# Patient Record
Sex: Female | Born: 1985 | Race: White | Hispanic: No | Marital: Single | Smoking: Light tobacco smoker
Health system: Southern US, Community
[De-identification: ages and names within clinical notes are randomized; demographics above are authoritative.]

## PROBLEM LIST (undated history)

## (undated) DIAGNOSIS — J45909 Unspecified asthma, uncomplicated: Secondary | ICD-10-CM

## (undated) DIAGNOSIS — R768 Other specified abnormal immunological findings in serum: Secondary | ICD-10-CM

---

## 2009-10-09 ENCOUNTER — Emergency Department: Payer: Self-pay | Admitting: Internal Medicine

## 2015-06-23 ENCOUNTER — Other Ambulatory Visit: Payer: Self-pay

## 2015-06-23 ENCOUNTER — Emergency Department
Admission: EM | Admit: 2015-06-23 | Discharge: 2015-06-23 | Disposition: A | Discharge status: Home / Self Care | Payer: Medicaid Other | Attending: Emergency Medicine | Admitting: Emergency Medicine

## 2015-06-23 ENCOUNTER — Emergency Department: Payer: Medicaid Other

## 2015-06-23 DIAGNOSIS — Z720 Tobacco use: Secondary | ICD-10-CM | POA: Insufficient documentation

## 2015-06-23 DIAGNOSIS — R197 Diarrhea, unspecified: Secondary | ICD-10-CM | POA: Insufficient documentation

## 2015-06-23 DIAGNOSIS — R079 Chest pain, unspecified: Secondary | ICD-10-CM | POA: Insufficient documentation

## 2015-06-23 DIAGNOSIS — R0602 Shortness of breath: Secondary | ICD-10-CM | POA: Diagnosis not present

## 2015-06-23 HISTORY — DX: Unspecified asthma, uncomplicated: J45.909

## 2015-06-23 LAB — CBC WITH DIFFERENTIAL/PLATELET
Basophils Absolute: 0 10*3/uL (ref 0–0.1)
Basophils Relative: 0 %
EOS PCT: 1 %
Eosinophils Absolute: 0.1 10*3/uL (ref 0–0.7)
HCT: 41.8 % (ref 35.0–47.0)
Hemoglobin: 14.2 g/dL (ref 12.0–16.0)
Lymphocytes Relative: 11 %
Lymphs Abs: 0.9 10*3/uL — ABNORMAL LOW (ref 1.0–3.6)
MCH: 29.1 pg (ref 26.0–34.0)
MCHC: 33.9 g/dL (ref 32.0–36.0)
MCV: 85.9 fL (ref 80.0–100.0)
MONO ABS: 1.2 10*3/uL — AB (ref 0.2–0.9)
MONOS PCT: 14 %
NEUTROS ABS: 6.3 10*3/uL (ref 1.4–6.5)
Neutrophils Relative %: 74 %
PLATELETS: 193 10*3/uL (ref 150–440)
RBC: 4.87 MIL/uL (ref 3.80–5.20)
RDW: 13.2 % (ref 11.5–14.5)
WBC: 8.5 10*3/uL (ref 3.6–11.0)

## 2015-06-23 LAB — BASIC METABOLIC PANEL
ANION GAP: 7 (ref 5–15)
BUN: 14 mg/dL (ref 6–20)
CALCIUM: 8.8 mg/dL — AB (ref 8.9–10.3)
CO2: 23 mmol/L (ref 22–32)
Chloride: 107 mmol/L (ref 101–111)
Creatinine, Ser: 0.66 mg/dL (ref 0.44–1.00)
GFR calc Af Amer: >60 mL/min (ref >60)
GFR calc non Af Amer: >60 mL/min (ref >60)
Glucose, Bld: 108 mg/dL — ABNORMAL HIGH (ref 65–99)
Potassium: 3.6 mmol/L (ref 3.5–5.1)
Sodium: 137 mmol/L (ref 135–145)

## 2015-06-23 LAB — TROPONIN I: Troponin I: <0.03 ng/mL (ref <0.031)

## 2015-06-23 MED ORDER — IPRATROPIUM-ALBUTEROL 0.5-2.5 (3) MG/3ML IN SOLN
3.0000 mL | Freq: Once | RESPIRATORY_TRACT | Status: AC
  Administered 2015-06-23: 3 mL via RESPIRATORY_TRACT
  Filled 2015-06-23: qty 3

## 2015-06-23 MED ORDER — ALBUTEROL SULFATE HFA 108 (90 BASE) MCG/ACT IN AERS: 2.0000 | INHALATION_SPRAY | Freq: Four times a day (QID) | RESPIRATORY_TRACT | Status: AC | PRN

## 2015-06-23 NOTE — ED Provider Notes (Signed)
[facility] Emergency Department Provider Note   ____________________________________________  Time seen: 0825  I have reviewed the triage vital signs and the nursing notes.   HISTORY  Chief Complaint Chest Pain and Shortness of Breath   History limited by: Not Limited   HPI Brandy Davies is a 29 y.o. female who presents to the emergency department today because of chest pain and shortness of breath. She states the symptoms started roughly 6 hours ago. They started suddenly. She describes the chest pain as a tight feeling located in the left chest. It has been constant. It is associated with shortness of breath. Patient states that yesterday she did have some nausea and vomiting as well as diarrhea. She continues to have some nausea and gagging. She denies any fevers the past couple of days.   Past Medical History  Diagnosis Date  . Asthma     There are no active problems to display for this patient.   History reviewed. No pertinent past surgical history.  No current outpatient prescriptions on file.  Allergies Review of patient's allergies indicates no known allergies.  No family history on file.  Social History History  Substance Use Topics  . Smoking status: Current Every Day Smoker    Types: Cigarettes  . Smokeless tobacco: Not on file  . Alcohol Use: No    Review of Systems  Constitutional: Negative for fever. Cardiovascular: Positive for chest pain. Respiratory: Positive for shortness of breath. Gastrointestinal: Negative for abdominal pain. Positive for diarrhea and vomiting. Genitourinary: Negative for dysuria. Musculoskeletal: Negative for back pain. Skin: Negative for rash. Neurological: Negative for headaches, focal weakness or numbness.  10-point ROS otherwise negative.  ____________________________________________   PHYSICAL EXAM:  VITAL SIGNS: ED Triage Vitals  Enc Vitals Group     BP 06/23/15 0755 112/62  mmHg     Pulse Rate 06/23/15 0755 90     Resp 06/23/15 0755 22     Temp 06/23/15 0755 97.6 F (36.4 C)     Temp Source 06/23/15 0755 Oral     SpO2 06/23/15 0755 98 %     Weight 06/23/15 0755 235 lb (106.595 kg)     Height 06/23/15 0755  (1.6 m)     Head Cir --      Peak Flow --      Pain Score 06/23/15 0755 4   Constitutional: Alert and oriented. Well appearing and in no distress. Eyes: Conjunctivae are normal. PERRL. Normal extraocular movements. ENT   Head: Normocephalic and atraumatic.   Nose: No congestion/rhinnorhea.   Mouth/Throat: Mucous membranes are moist.   Neck: No stridor. Hematological/Lymphatic/Immunilogical: No cervical lymphadenopathy. Cardiovascular: Normal rate, regular rhythm.  No murmurs, rubs, or gallops. Respiratory: Normal respiratory effort without tachypnea nor retractions. Breath sounds are clear and equal bilaterally. No wheezes/rales/rhonchi. Gastrointestinal: Soft and nontender. No distention.  Genitourinary: Deferred Musculoskeletal: Normal range of motion in all extremities. No joint effusions.  No lower extremity tenderness nor edema. Neurologic:  Normal speech and language. No gross focal neurologic deficits are appreciated. Speech is normal.  Skin:  Skin is warm, dry and intact. No rash noted. Psychiatric: Mood and affect are normal. Speech and behavior are normal. Patient exhibits appropriate insight and judgment.  ____________________________________________    LABS (pertinent positives/negatives)  Labs Reviewed  CBC WITH DIFFERENTIAL/PLATELET - Abnormal; Notable for the following:    Lymphs Abs 0.9 (*)    Monocytes Absolute 1.2 (*)    All other components within normal limits  BASIC METABOLIC PANEL - Abnormal; Notable for the following:    Glucose, Bld 108 (*)    Calcium 8.8 (*)    All other components within normal limits  TROPONIN I     ____________________________________________   EKG  I, Phineas Semen,  attending physician, personally viewed and interpreted this EKG  EKG Time: 0800 Rate: 90 Rhythm: normal sinus rhythm Axis: normal Intervals: normal QRS: normal ST changes: no st elevation    ____________________________________________    RADIOLOGY  CXR IMPRESSION: No active cardiopulmonary disease.  ____________________________________________   PROCEDURES  Procedure(s) performed: None  Critical Care performed: No  ____________________________________________   INITIAL IMPRESSION / ASSESSMENT AND PLAN / ED COURSE  Pertinent labs & imaging results that were available during my care of the patient were reviewed by me and considered in my medical decision making (see chart for details).  Patient presented to the emergency department today because of concerns for shortness breath and chest pain. Patient had EKG chest x-ray and blood work done without any concerning findings. PERC negative. Patient did state she felt better after the DuoNeb treatment. Does have a history of asthma. Will discharge home with albuterol inhaler. Discussed return precautions.  ____________________________________________   FINAL CLINICAL IMPRESSION(S) / ED DIAGNOSES  Final diagnoses:  Chest pain, unspecified chest pain type  Shortness of breath     Phineas Semen, MD 06/23/15 1031

## 2015-06-23 NOTE — Discharge Instructions (Signed)
Please seek medical attention for any high fevers, chest pain, shortness of breath, change in behavior, persistent vomiting, bloody stool or any other new or concerning symptoms. ° °Chest Pain (Nonspecific) °It is often hard to give a specific diagnosis for the cause of chest pain. There is always a chance that your pain could be related to something serious, such as a heart attack or a blood clot in the lungs. You need to follow up with your health care provider for further evaluation. °CAUSES  °· Heartburn. °· Pneumonia or bronchitis. °· Anxiety or stress. °· Inflammation around your heart (pericarditis) or lung (pleuritis or pleurisy). °· A blood clot in the lung. °· A collapsed lung (pneumothorax). It can develop suddenly on its own (spontaneous pneumothorax) or from trauma to the chest. °· Shingles infection (herpes zoster virus). °The chest wall is composed of bones, muscles, and cartilage. Any of these can be the source of the pain. °· The bones can be bruised by injury. °· The muscles or cartilage can be strained by coughing or overwork. °· The cartilage can be affected by inflammation and become sore (costochondritis). °DIAGNOSIS  °Lab tests or other studies may be needed to find the cause of your pain. Your health care provider may have you take a test called an ambulatory electrocardiogram (ECG). An ECG records your heartbeat patterns over a 24-hour period. You may also have other tests, such as: °· Transthoracic echocardiogram (TTE). During echocardiography, sound waves are used to evaluate how blood flows through your heart. °· Transesophageal echocardiogram (TEE). °· Cardiac monitoring. This allows your health care provider to monitor your heart rate and rhythm in real time. °· Holter monitor. This is a portable device that records your heartbeat and can help diagnose heart arrhythmias. It allows your health care provider to track your heart activity for several days, if needed. °· Stress tests by  exercise or by giving medicine that makes the heart beat faster. °TREATMENT  °· Treatment depends on what may be causing your chest pain. Treatment may include: °¨ Acid blockers for heartburn. °¨ Anti-inflammatory medicine. °¨ Pain medicine for inflammatory conditions. °¨ Antibiotics if an infection is present. °· You may be advised to change lifestyle habits. This includes stopping smoking and avoiding alcohol, caffeine, and chocolate. °· You may be advised to keep your head raised (elevated) when sleeping. This reduces the chance of acid going backward from your stomach into your esophagus. °Most of the time, nonspecific chest pain will improve within 2-3 days with rest and mild pain medicine.  °HOME CARE INSTRUCTIONS  °· If antibiotics were prescribed, take them as directed. Finish them even if you start to feel better. °· For the next few days, avoid physical activities that bring on chest pain. Continue physical activities as directed. °· Do not use any tobacco products, including cigarettes, chewing tobacco, or electronic cigarettes. °· Avoid drinking alcohol. °· Only take medicine as directed by your health care provider. °· Follow your health care provider's suggestions for further testing if your chest pain does not go away. °· Keep any follow-up appointments you made. If you do not go to an appointment, you could develop lasting (chronic) problems with pain. If there is any problem keeping an appointment, call to reschedule. °SEEK MEDICAL CARE IF:  °· Your chest pain does not go away, even after treatment. °· You have a rash with blisters on your chest. °· You have a fever. °SEEK IMMEDIATE MEDICAL CARE IF:  °· You have increased chest   pain or pain that spreads to your arm, neck, jaw, back, or abdomen. °· You have shortness of breath. °· You have an increasing cough, or you cough up blood. °· You have severe back or abdominal pain. °· You feel nauseous or vomit. °· You have severe weakness. °· You  faint. °· You have chills. °This is an emergency. Do not wait to see if the pain will go away. Get medical help at once. Call your local emergency services (911 in [place] Do not drive yourself to the hospital. °MAKE SURE YOU:  °· Understand these instructions. °· Will watch your condition. °· Will get help right away if you are not doing well or get worse. °Document Released: 08/28/2005 Document Revised: 11/23/2013 Document Reviewed: 06/23/2008 °ExitCare® Patient Information ©2015 [employer]. This information is not intended to replace advice given to you by your health care provider. Make sure you discuss any questions you have with your health care provider. ° °

## 2015-06-23 NOTE — ED Notes (Signed)
Pt c/o having N/V yesterday and then last night began having chest pressure with SOB through today.Marland Kitchen

## 2015-10-12 ENCOUNTER — Ambulatory Visit
Admission: EM | Admit: 2015-10-12 | Discharge: 2015-10-12 | Disposition: A | Discharge status: Home / Self Care | Payer: Medicaid Other | Attending: Family Medicine | Admitting: Family Medicine

## 2015-10-12 ENCOUNTER — Emergency Department: Payer: Medicaid Other

## 2015-10-12 ENCOUNTER — Encounter: Payer: Self-pay | Admitting: Emergency Medicine

## 2015-10-12 ENCOUNTER — Emergency Department
Admission: EM | Admit: 2015-10-12 | Discharge: 2015-10-12 | Disposition: A | Discharge status: Home / Self Care | Payer: Medicaid Other | Attending: Student | Admitting: Student

## 2015-10-12 DIAGNOSIS — O200 Threatened abortion: Secondary | ICD-10-CM | POA: Insufficient documentation

## 2015-10-12 DIAGNOSIS — O209 Hemorrhage in early pregnancy, unspecified: Secondary | ICD-10-CM | POA: Diagnosis present

## 2015-10-12 DIAGNOSIS — O210 Mild hyperemesis gravidarum: Secondary | ICD-10-CM | POA: Diagnosis not present

## 2015-10-12 DIAGNOSIS — N939 Abnormal uterine and vaginal bleeding, unspecified: Secondary | ICD-10-CM | POA: Diagnosis present

## 2015-10-12 DIAGNOSIS — Z79899 Other long term (current) drug therapy: Secondary | ICD-10-CM | POA: Insufficient documentation

## 2015-10-12 DIAGNOSIS — Z3A01 Less than 8 weeks gestation of pregnancy: Secondary | ICD-10-CM | POA: Insufficient documentation

## 2015-10-12 DIAGNOSIS — F17210 Nicotine dependence, cigarettes, uncomplicated: Secondary | ICD-10-CM | POA: Insufficient documentation

## 2015-10-12 DIAGNOSIS — R112 Nausea with vomiting, unspecified: Secondary | ICD-10-CM | POA: Diagnosis present

## 2015-10-12 DIAGNOSIS — O99331 Smoking (tobacco) complicating pregnancy, first trimester: Secondary | ICD-10-CM | POA: Insufficient documentation

## 2015-10-12 HISTORY — DX: Other specified abnormal immunological findings in serum: R76.8

## 2015-10-12 LAB — URINALYSIS COMPLETE WITH MICROSCOPIC (ARMC ONLY)
BILIRUBIN URINE: NEGATIVE
GLUCOSE, UA: NEGATIVE mg/dL
KETONES UR: NEGATIVE mg/dL
Leukocytes, UA: NEGATIVE
NITRITE: NEGATIVE
Protein, ur: NEGATIVE mg/dL
SPECIFIC GRAVITY, URINE: 1.02 (ref 1.005–1.030)
pH: 7 (ref 5.0–8.0)

## 2015-10-12 LAB — COMPREHENSIVE METABOLIC PANEL
ALT: 20 U/L (ref 14–54)
ANION GAP: 5 (ref 5–15)
AST: 20 U/L (ref 15–41)
Albumin: 4.1 g/dL (ref 3.5–5.0)
Alkaline Phosphatase: 55 U/L (ref 38–126)
BUN: 10 mg/dL (ref 6–20)
CO2: 22 mmol/L (ref 22–32)
Calcium: 8.8 mg/dL — ABNORMAL LOW (ref 8.9–10.3)
Chloride: 108 mmol/L (ref 101–111)
Creatinine, Ser: 0.54 mg/dL (ref 0.44–1.00)
GFR calc Af Amer: >60 mL/min (ref >60)
Glucose, Bld: 103 mg/dL — ABNORMAL HIGH (ref 65–99)
Potassium: 3.9 mmol/L (ref 3.5–5.1)
SODIUM: 135 mmol/L (ref 135–145)
TOTAL PROTEIN: 7.4 g/dL (ref 6.5–8.1)
Total Bilirubin: 0.5 mg/dL (ref 0.3–1.2)

## 2015-10-12 LAB — CBC WITH DIFFERENTIAL/PLATELET
Basophils Absolute: 0.1 10*3/uL (ref 0–0.1)
Basophils Relative: 1 %
EOS ABS: 0.1 10*3/uL (ref 0–0.7)
EOS PCT: 1 %
HCT: 43.4 % (ref 35.0–47.0)
Hemoglobin: 14.6 g/dL (ref 12.0–16.0)
LYMPHS ABS: 2.2 10*3/uL (ref 1.0–3.6)
Lymphocytes Relative: 23 %
MCH: 29.1 pg (ref 26.0–34.0)
MCHC: 33.7 g/dL (ref 32.0–36.0)
MCV: 86.5 fL (ref 80.0–100.0)
Monocytes Absolute: 0.5 10*3/uL (ref 0.2–0.9)
Monocytes Relative: 6 %
NEUTROS ABS: 6.4 10*3/uL (ref 1.4–6.5)
NEUTROS PCT: 69 %
PLATELETS: 236 10*3/uL (ref 150–440)
RBC: 5.02 MIL/uL (ref 3.80–5.20)
RDW: 12.8 % (ref 11.5–14.5)
WBC: 9.2 10*3/uL (ref 3.6–11.0)

## 2015-10-12 LAB — LIPASE, BLOOD: LIPASE: 31 U/L (ref 11–51)

## 2015-10-12 LAB — PREGNANCY, URINE: Preg Test, Ur: POSITIVE — AB

## 2015-10-12 LAB — ANTIBODY SCREEN: ANTIBODY SCREEN: NEGATIVE

## 2015-10-12 LAB — HCG, QUANTITATIVE, PREGNANCY: HCG, BETA CHAIN, QUANT, S: 34492 m[IU]/mL — AB (ref <5)

## 2015-10-12 LAB — ABO/RH: ABO/RH(D): A NEG

## 2015-10-12 LAB — POCT PREGNANCY, URINE: Preg Test, Ur: POSITIVE — AB

## 2015-10-12 MED ORDER — SODIUM CHLORIDE 0.9 % IV BOLUS (SEPSIS): 1000.0000 mL | Freq: Once | INTRAVENOUS | Status: DC

## 2015-10-12 MED ORDER — RHO D IMMUNE GLOBULIN 1500 UNIT/2ML IJ SOSY
300.0000 ug | PREFILLED_SYRINGE | Freq: Once | INTRAMUSCULAR | Status: AC
  Administered 2015-10-12: 300 ug via INTRAMUSCULAR
  Filled 2015-10-12: qty 2

## 2015-10-12 NOTE — ED Notes (Signed)
Called blood bank about rho immune globulin injection, vial in process, blood bank will call when ready.

## 2015-10-12 NOTE — Discharge Instructions (Signed)
Go directly to the Emergency room as discussed. Return to Urgent care as needed.   Threatened Miscarriage A threatened miscarriage occurs when you have vaginal bleeding during your first 20 weeks of pregnancy but the pregnancy has not ended. If you have vaginal bleeding during this time, your health care provider will do tests to make sure you are still pregnant. If the tests show you are still pregnant and the developing baby (fetus) inside your womb (uterus) is still growing, your condition is considered a threatened miscarriage. A threatened miscarriage does not mean your pregnancy will end, but it does increase the risk of losing your pregnancy (complete miscarriage). CAUSES  The cause of a threatened miscarriage is usually not known. If you go on to have a complete miscarriage, the most common cause is an abnormal number of chromosomes in the developing baby. Chromosomes are the structures inside cells that hold all your genetic material. Some causes of vaginal bleeding that do not result in miscarriage include:  Having sex.  Having an infection.  Normal hormone changes of pregnancy.  Bleeding that occurs when an egg implants in your uterus. RISK FACTORS Risk factors for bleeding in early pregnancy include:  Obesity.  Smoking.  Drinking excessive amounts of alcohol or caffeine.  Recreational drug use. SIGNS AND SYMPTOMS  Light vaginal bleeding.  Mild abdominal pain or cramps. DIAGNOSIS  If you have bleeding with or without abdominal pain before 20 weeks of pregnancy, your health care provider will do tests to check whether you are still pregnant. One important test involves using sound waves and a computer (ultrasound) to create images of the inside of your uterus. Other tests include an internal exam of your vagina and uterus (pelvic exam) and measurement of your baby's heart rate.  You may be diagnosed with a threatened miscarriage if:  Ultrasound testing shows you are still  pregnant.  Your baby's heart rate is strong.  A pelvic exam shows that the opening between your uterus and your vagina (cervix) is closed.  Your heart rate and blood pressure are stable.  Blood tests confirm you are still pregnant. TREATMENT  No treatments have been shown to prevent a threatened miscarriage from going on to a complete miscarriage. However, the right home care is important.  HOME CARE INSTRUCTIONS   Make sure you keep all your appointments for prenatal care. This is very important.  Get plenty of rest.  Do not have sex or use tampons if you have vaginal bleeding.  Do not douche.  Do not smoke or use recreational drugs.  Do not drink alcohol.  Avoid caffeine. SEEK MEDICAL CARE IF:  You have light vaginal bleeding or spotting while pregnant.  You have abdominal pain or cramping.  You have a fever. SEEK IMMEDIATE MEDICAL CARE IF:  You have heavy vaginal bleeding.  You have blood clots coming from your vagina.  You have severe low back pain or abdominal cramps.  You have fever, chills, and severe abdominal pain. MAKE SURE YOU:  Understand these instructions.  Will watch your condition.  Will get help right away if you are not doing well or get worse.   This information is not intended to replace advice given to you by your health care provider. Make sure you discuss any questions you have with your health care provider.   Document Released: 11/18/2005 Document Revised: 11/23/2013 Document Reviewed: 09/14/2013 Elsevier Interactive Patient Education 2016 Elsevier Inc.  Vaginal Bleeding During Pregnancy, First Trimester A small amount of bleeding (  spotting) from the vagina is relatively common in early pregnancy. It usually stops on its own. Various things may cause bleeding or spotting in early pregnancy. Some bleeding may be related to the pregnancy, and some may not. In most cases, the bleeding is normal and is not a problem. However, bleeding can  also be a sign of something serious. Be sure to tell your health care provider about any vaginal bleeding right away. Some possible causes of vaginal bleeding during the first trimester include:  Infection or inflammation of the cervix.  Growths (polyps) on the cervix.  Miscarriage or threatened miscarriage.  Pregnancy tissue has developed outside of the uterus and in a fallopian tube (tubal pregnancy).  Tiny cysts have developed in the uterus instead of pregnancy tissue (molar pregnancy). HOME CARE INSTRUCTIONS  Watch your condition for any changes. The following actions may help to lessen any discomfort you are feeling:  Follow your health care provider's instructions for limiting your activity. If your health care provider orders bed rest, you may need to stay in bed and only get up to use the bathroom. However, your health care provider may allow you to continue light activity.  If needed, make plans for someone to help with your regular activities and responsibilities while you are on bed rest.  Keep track of the number of pads you use each day, how often you change pads, and how soaked (saturated) they are. Write this down.  Do not use tampons. Do not douche.  Do not have sexual intercourse or orgasms until approved by your health care provider.  If you pass any tissue from your vagina, save the tissue so you can show it to your health care provider.  Only take over-the-counter or prescription medicines as directed by your health care provider.  Do not take aspirin because it can make you bleed.  Keep all follow-up appointments as directed by your health care provider. SEEK MEDICAL CARE IF:  You have any vaginal bleeding during any part of your pregnancy.  You have cramps or labor pains.  You have a fever, not controlled by medicine. SEEK IMMEDIATE MEDICAL CARE IF:   You have severe cramps in your back or belly (abdomen).  You pass large clots or tissue from your  vagina.  Your bleeding increases.  You feel light-headed or weak, or you have fainting episodes.  You have chills.  You are leaking fluid or have a gush of fluid from your vagina.  You pass out while having a bowel movement. MAKE SURE YOU:  Understand these instructions.  Will watch your condition.  Will get help right away if you are not doing well or get worse.   This information is not intended to replace advice given to you by your health care provider. Make sure you discuss any questions you have with your health care provider.   Document Released: 08/28/2005 Document Revised: 11/23/2013 Document Reviewed: 07/26/2013 Elsevier Interactive Patient Education [employer] Inc[employer]

## 2015-10-12 NOTE — ED Provider Notes (Signed)
[facility]  ____________________________________________  Time seen: Approximately 8:53 AM  I have reviewed the triage vital signs and the nursing notes.   HISTORY  Chief Complaint Emesis During Pregnancy   HPI Latessa Tillis is a 29 y.o. female presents today for the complaints of vaginal bleeding times one episode this morning. Patient reports that she has been having intermittent nausea times approximately one week and reports that this morning she had several episodes of back-to-back vomiting. States still with some nausea at this time but improved overall. States this morning she also noticed that she had some vaginal bleeding. Patient states that the vaginal bleeding was bright red and moderate amount. Denies bleeding since. Denies rectal bleeding or blood in urine. Denies other vaginal discharge. Patient reports that she has been actively trying to conceive and reports that she took a urine pregnancy test at home yesterday which was quickly positive. States one sexual partner. Denies abdominal pain.  Last menstrual 09/08/2015  Patient states that she presented today as soon as she noticed the vaginal bleeding as she has a history of Rh- and she knows that she is pregnant she needs further monitoring intervention.  Reports gravida 2 para 3. Reports she hasn't an 40 year old child and twin 8 year-olds. Denies history of miscarriages or pregnancy complications.   Past Medical History  Diagnosis Date  . Asthma   . Rheumatoid factor positive     There are no active problems to display for this patient.   History reviewed. No pertinent past surgical history.  Current Outpatient Rx  Name  Route  Sig  Dispense  Refill  .           .           .             Allergies Review of patient's allergies indicates no known allergies.  History reviewed. No pertinent family history.  Social History Social History  Substance Use Topics  . Smoking status: Light Tobacco  Smoker    Types: Cigarettes  . Smokeless tobacco: None  . Alcohol Use: No    Review of Systems Constitutional: No fever/chills Eyes: No visual changes. ENT: No sore throat. Cardiovascular: Denies chest pain. Respiratory: Denies shortness of breath. Gastrointestinal: No abdominal pain.  Positive nausea and vomiting as above..  No diarrhea.  No constipation. Genitourinary: Negative for dysuria. Vaginal bleeding. Musculoskeletal: Negative for back pain. Skin: Negative for rash. Neurological: Negative for headaches, focal weakness or numbness.  10-point ROS otherwise negative.  ____________________________________________   PHYSICAL EXAM:  VITAL SIGNS: ED Triage Vitals  Enc Vitals Group     BP 10/12/15 0813 103/69 mmHg     Pulse Rate 10/12/15 0813 77     Resp 10/12/15 0813 18     Temp 10/12/15 0813 98.2 F (36.8 C)     Temp Source 10/12/15 0813 Oral     SpO2 10/12/15 0813 100 %     Weight 10/12/15 0813 239 lb (108.41 kg)     Height 10/12/15 0813  (1.575 m)     Head Cir --      Peak Flow --      Pain Score 10/12/15 0813 0     Pain Loc --      Pain Edu? --      Excl. in GC? --     Constitutional: Alert and oriented. Well appearing and in no acute distress. Eyes: Conjunctivae are normal. PERRL. EOMI. Head: Atraumatic.  Nose: No  congestion/rhinnorhea.  Mouth/Throat: Mucous membranes are moist.  Oropharynx non-erythematous. Neck: No stridor.  No cervical spine tenderness to palpation. Hematological/Lymphatic/Immunilogical: No cervical lymphadenopathy. Cardiovascular: Normal rate, regular rhythm. Grossly normal heart sounds.  Good peripheral circulation. Respiratory: Normal respiratory effort.  No retractions. Lungs CTAB.No wheezes, rales or rhonchi.  Gastrointestinal: Soft and nontender. No distention. Normal Bowel sounds.  No CVA tenderness. Musculoskeletal: No lower or upper extremity tenderness nor edema.  No joint effusions. Bilateral pedal pulses equal and  easily palpated.  Neurologic:  Normal speech and language. No gross focal neurologic deficits are appreciated. No gait instability. Skin:  Skin is warm, dry and intact. No rash noted. Psychiatric: Mood and affect are normal. Speech and behavior are normal.  __________________________________________   INITIAL IMPRESSION / ASSESSMENT AND PLAN / ED COURSE  Pertinent labs & imaging results that were available during my care of the patient were reviewed by me and considered in my medical decision making (see chart for details).  Very well-appearing patient. No acute distress. Presents for the complaint of vaginal bleeding, nausea and vomiting this morning with a positive pregnancy tests at home yesterday. Patient does report that she is Rh factor negative and does report that she's had RhoGAM pre-and post all deliveries. Discussed in detail with patient as positive pregnancy test at home with reported vaginal bleeding patient will need further evaluation. Discussed with patient she will need pregnancy evaluation as well as evaluation for vaginal bleeding and following up with OB/GYN. Discussed with patient that she will need to be further evaluated in the emergency room of her choice or OB/GYN office today. Patient states that she will [facility]. Patient alert and oriented with decisional capacity and reports that she will drive herself. RN charge nurse Tammy SoursGreg called and report given by myself.   Discussed follow up with Primary care physician this week. Discussed follow up and return parameters including no resolution or any worsening concerns. Patient verbalized understanding and agreed to plan.   ____________________________________________   FINAL CLINICAL IMPRESSION(S) / ED DIAGNOSES  Final diagnoses:  Threatened miscarriage       Renford DillsLindsey Dajaun Goldring, NP 10/12/15 1306  Renford DillsLindsey Jaykwon Morones, NP 10/12/15 1309

## 2015-10-12 NOTE — ED Notes (Signed)
Patient transported to Ultrasound 

## 2015-10-12 NOTE — ED Provider Notes (Signed)
[facility] Emergency Department Provider Note  ____________________________________________  Time seen: Approximately 9:54 AM  I have reviewed the triage vital signs and the nursing notes.   HISTORY  Chief Complaint Vaginal Bleeding and Morning Sickness    HPI Brandy Davies is a 29 y.o. female with history of asthma, G3P3 naproxen 5 weeks estimated gestational age by last menstrual period who presents for evaluation of vaginal bleeding, gradual onset this morning, now resolved. The patient reports that she passed a  blood clot from her vagina earlier this morning but has not seen any further vaginal bleeding. She is having nausea and nonbloody vomiting which is consistent with her usual morning sickness. She reports she has ear program shots in the past and was sent from urgent care for) miscarriage and rhogam administration. She denies any abdominal pain at this time. Currently her symptoms are mild. No modifying factors. No fevers, chills, chest pain, difficulty breathing.   Past Medical History  Diagnosis Date  . Asthma   . Rheumatoid factor positive     There are no active problems to display for this patient.   History reviewed. No pertinent past surgical history.  Current Outpatient Rx  Name  Route  Sig  Dispense  Refill  . acetaminophen (TYLENOL) 500 MG tablet   Oral   Take 500 mg by mouth every 8 (eight) hours as needed for mild pain.         Marland Kitchen albuterol (PROVENTIL HFA;VENTOLIN HFA) 108 (90 BASE) MCG/ACT inhaler   Inhalation   Inhale 2 puffs into the lungs every 6 (six) hours as needed for wheezing or shortness of breath.   1 Inhaler   0   . ranitidine (ZANTAC) 300 MG tablet   Oral   Take 300 mg by mouth every morning.           Allergies Review of patient's allergies indicates no known allergies.  No family history on file.  Social History Social History  Substance Use Topics  . Smoking status: Light Tobacco Smoker     Types: Cigarettes  . Smokeless tobacco: None  . Alcohol Use: No    Review of Systems Constitutional: No fever/chills Eyes: No visual changes. ENT: No sore throat. Cardiovascular: Denies chest pain. Respiratory: Denies shortness of breath. Gastrointestinal: No abdominal pain.  + nausea, + vomiting.  No diarrhea.  No constipation. Genitourinary: Negative for dysuria. Musculoskeletal: Negative for back pain. Skin: Negative for rash. Neurological: Negative for headaches, focal weakness or numbness.  10-point ROS otherwise negative.  ____________________________________________   PHYSICAL EXAM:  VITAL SIGNS: ED Triage Vitals  Enc Vitals Group     BP 10/12/15 0927 131/87 mmHg     Pulse Rate 10/12/15 0927 77     Resp 10/12/15 0927 18     Temp 10/12/15 0927 97.6 F (36.4 C)     Temp Source 10/12/15 0927 Oral     SpO2 10/12/15 0927 98 %     Weight --      Height 10/12/15 0927  (1.575 m)     Head Cir --      Peak Flow --      Pain Score --      Pain Loc --      Pain Edu? --      Excl. in GC? --     Constitutional: Alert and oriented. Well appearing and in no acute distress. Eyes: Conjunctivae are normal. PERRL. EOMI. Head: Atraumatic. Nose: No congestion/rhinnorhea. Mouth/Throat: Mucous membranes are  moist.  Oropharynx non-erythematous. Neck: No stridor.  Cardiovascular: Normal rate, regular rhythm. Grossly normal heart sounds.  Good peripheral circulation. Respiratory: Normal respiratory effort.  No retractions. Lungs CTAB. Gastrointestinal: Soft and nontender. No distention. No CVA tenderness. Genitourinary: deferred Musculoskeletal: No lower extremity tenderness nor edema.  No joint effusions. Neurologic:  Normal speech and language. No gross focal neurologic deficits are appreciated.  Skin:  Skin is warm, dry and intact. No rash noted. Psychiatric: Mood and affect are normal. Speech and behavior are normal.  ____________________________________________    LABS (all labs ordered are listed, but only abnormal results are displayed)  Labs Reviewed  HCG, QUANTITATIVE, PREGNANCY - Abnormal; Notable for the following:    hCG, Beta Chain, Quant, S 34492 (*)    All other components within normal limits  COMPREHENSIVE METABOLIC PANEL - Abnormal; Notable for the following:    Glucose, Bld 103 (*)    Calcium 8.8 (*)    All other components within normal limits  URINALYSIS COMPLETEWITH MICROSCOPIC ([facility] ONLY) - Abnormal; Notable for the following:    Color, Urine YELLOW (*)    APPearance HAZY (*)    Hgb urine dipstick 2+ (*)    Bacteria, UA RARE (*)    Squamous Epithelial / LPF 6-30 (*)    All other components within normal limits  POCT PREGNANCY, URINE - Abnormal; Notable for the following:    Preg Test, Ur POSITIVE (*)    All other components within normal limits  CBC WITH DIFFERENTIAL/PLATELET  LIPASE, BLOOD  ABO/RH  ANTIBODY SCREEN  RHOGAM INJECTION   ____________________________________________  EKG  none ____________________________________________  RADIOLOGY  Transvaginal ultrasound  IMPRESSION: Single live IUP with estimated gestational age [redacted] weeks 5 days.   ____________________________________________   PROCEDURES  Procedure(s) performed: None  Critical Care performed: No  ____________________________________________   INITIAL IMPRESSION / ASSESSMENT AND PLAN / ED COURSE  Pertinent labs & imaging results that were available during my care of the patient were reviewed by me and considered in my medical decision making (see chart for details).  Brandy Davies is a 29 y.o. female with history of asthma, G3P3 naproxen 5 weeks estimated gestational age by last menstrual period who presents for evaluation of resolved vaginal bleeding in early pregnancy concerning for threatened miscarriage. On exam, she is very well-appearing and in no acute distress. Vital signs stable, she is afebrile. She has a benign  examination. Plan for screening labs, ECG, ultrasound and likely rhogam administration. Reassess for disposition. She has refused an IV stating that she would prefer to orally hydrate.  ----------------------------------------- 1:21 PM on 10/12/2015 -----------------------------------------  Labs reviewed. Normal CBC, CMP, lipase. Blood type is A-, rhogam given. Beta hCG was elevated at 34,000 and ultrasound shows single live intrauterine pregnancy at 5 weeks estimated gestational age. Urinalysis not consistent with infection. We discussed return precautions, need for close OB/GYN follow-up and the patient is comfortable with discharge plan. DC home. ____________________________________________   FINAL CLINICAL IMPRESSION(S) / ED DIAGNOSES  Final diagnoses:  Threatened miscarriage in early pregnancy      Gayla DossEryka A Koni Kannan, MD 10/12/15 1324

## 2015-10-12 NOTE — ED Notes (Signed)
Pt took a pregnancy test this week that was positive, had some vaginal bleeding last night, none since, however, continues to have nausea and vomiting. Denies cramping. States she had bleeding while pregnant with her twins as well. Was sent [facility] stating she needs her RH neg shot.

## 2015-10-12 NOTE — ED Notes (Signed)
Discussed discharge instructions and follow-up care with the patient. No questions or concerns at this time. Pt stable at discharge.  

## 2015-10-12 NOTE — ED Notes (Signed)
Patient states she has been nauseated for the past several days and took a pregnancy test yesterday and it was positive. This morning increased vomiting and passed a blood clot.

## 2015-10-13 LAB — RHOGAM INJECTION: UNIT DIVISION: 0

## 2015-10-26 ENCOUNTER — Emergency Department
Admission: EM | Admit: 2015-10-26 | Discharge: 2015-10-26 | Disposition: A | Discharge status: Home / Self Care | Payer: Medicaid Other | Attending: Emergency Medicine | Admitting: Emergency Medicine

## 2015-10-26 ENCOUNTER — Emergency Department: Payer: Medicaid Other

## 2015-10-26 ENCOUNTER — Encounter: Payer: Self-pay | Admitting: Emergency Medicine

## 2015-10-26 DIAGNOSIS — O99331 Smoking (tobacco) complicating pregnancy, first trimester: Secondary | ICD-10-CM | POA: Diagnosis not present

## 2015-10-26 DIAGNOSIS — Z3A08 8 weeks gestation of pregnancy: Secondary | ICD-10-CM | POA: Diagnosis not present

## 2015-10-26 DIAGNOSIS — F17210 Nicotine dependence, cigarettes, uncomplicated: Secondary | ICD-10-CM | POA: Insufficient documentation

## 2015-10-26 DIAGNOSIS — N939 Abnormal uterine and vaginal bleeding, unspecified: Secondary | ICD-10-CM

## 2015-10-26 DIAGNOSIS — O209 Hemorrhage in early pregnancy, unspecified: Secondary | ICD-10-CM | POA: Insufficient documentation

## 2015-10-26 DIAGNOSIS — Z79899 Other long term (current) drug therapy: Secondary | ICD-10-CM | POA: Insufficient documentation

## 2015-10-26 LAB — URINALYSIS COMPLETE WITH MICROSCOPIC (ARMC ONLY)
BILIRUBIN URINE: NEGATIVE
Glucose, UA: NEGATIVE mg/dL
Ketones, ur: NEGATIVE mg/dL
Leukocytes, UA: NEGATIVE
Nitrite: NEGATIVE
PH: 7 (ref 5.0–8.0)
Protein, ur: NEGATIVE mg/dL
Specific Gravity, Urine: 1.014 (ref 1.005–1.030)

## 2015-10-26 LAB — CBC WITH DIFFERENTIAL/PLATELET
BASOS ABS: 0.1 10*3/uL (ref 0–0.1)
Basophils Relative: 1 %
EOS ABS: 0.1 10*3/uL (ref 0–0.7)
EOS PCT: 1 %
HCT: 39.4 % (ref 35.0–47.0)
Hemoglobin: 13.4 g/dL (ref 12.0–16.0)
Lymphocytes Relative: 24 %
Lymphs Abs: 3.1 10*3/uL (ref 1.0–3.6)
MCH: 29.3 pg (ref 26.0–34.0)
MCHC: 34.1 g/dL (ref 32.0–36.0)
MCV: 86 fL (ref 80.0–100.0)
Monocytes Absolute: 0.9 10*3/uL (ref 0.2–0.9)
Monocytes Relative: 7 %
Neutro Abs: 8.6 10*3/uL — ABNORMAL HIGH (ref 1.4–6.5)
Neutrophils Relative %: 67 %
PLATELETS: 226 10*3/uL (ref 150–440)
RBC: 4.58 MIL/uL (ref 3.80–5.20)
RDW: 13.1 % (ref 11.5–14.5)
WBC: 12.7 10*3/uL — AB (ref 3.6–11.0)

## 2015-10-26 LAB — BASIC METABOLIC PANEL
ANION GAP: 6 (ref 5–15)
BUN: 12 mg/dL (ref 6–20)
CALCIUM: 8.8 mg/dL — AB (ref 8.9–10.3)
CO2: 24 mmol/L (ref 22–32)
Chloride: 107 mmol/L (ref 101–111)
Creatinine, Ser: 0.78 mg/dL (ref 0.44–1.00)
GFR calc Af Amer: >60 mL/min (ref >60)
Glucose, Bld: 97 mg/dL (ref 65–99)
POTASSIUM: 3.6 mmol/L (ref 3.5–5.1)
SODIUM: 137 mmol/L (ref 135–145)

## 2015-10-26 LAB — APTT: APTT: 28 s (ref 24–36)

## 2015-10-26 LAB — PROTIME-INR
INR: 1.03
PROTHROMBIN TIME: 13.7 s (ref 11.4–15.0)

## 2015-10-26 LAB — ABO/RH: ABO/RH(D): A NEG

## 2015-10-26 LAB — HCG, QUANTITATIVE, PREGNANCY: HCG, BETA CHAIN, QUANT, S: 138987 m[IU]/mL — AB (ref <5)

## 2015-10-26 MED ORDER — PROMETHAZINE HCL 25 MG PO TABS
25.0000 mg | ORAL_TABLET | Freq: Once | ORAL | Status: AC
  Administered 2015-10-26: 25 mg via ORAL
  Filled 2015-10-26: qty 1

## 2015-10-26 MED ORDER — ACETAMINOPHEN 500 MG PO TABS
1000.0000 mg | ORAL_TABLET | ORAL | Status: DC
  Filled 2015-10-26: qty 2

## 2015-10-26 MED ORDER — PROMETHAZINE HCL 25 MG PO TABS: 25.0000 mg | ORAL_TABLET | Freq: Three times a day (TID) | ORAL | Status: AC | PRN

## 2015-10-26 NOTE — ED Notes (Signed)
Recently found out she was pregnant.  At 1330, after peeing, wiped and saw small amount of blood.  1630 wiped after peeing again, blood was more red on tissue.  Patient was seen in ED about three weeks ago for vaginal bleeding and vomiting when she found out she was pregnant.

## 2015-10-26 NOTE — ED Provider Notes (Signed)
[facility] Emergency Department Provider Note REMINDER - THIS NOTE IS NOT A FINAL MEDICAL RECORD UNTIL IT IS SIGNED. UNTIL THEN, THE CONTENT BELOW MAY REFLECT INFORMATION FROM A DOCUMENTATION TEMPLATE, NOT THE ACTUAL PATIENT VISIT. ____________________________________________  Time seen: Approximately 7:57 PM  I have reviewed the triage vital signs and the nursing notes.   HISTORY  Chief Complaint Vaginal Bleeding    HPI Brandy Davies is a 29 y.o. female G4 P3 resents for evaluation of a slight amount of red vaginal bleeding starting around noon today. Patient reports that after intercourse she went to PE and no such she had a slight amount of blood. She then urinated again about 4:30 and some more red blood which is about the amount of early period. She states at this point that the bleeding has stopped and she is not having any pain fever or other symptoms. She did have some bleeding about 2 weeks ago and came for evaluation, she had a RhoGAM shot done at that time.  She reports approximate [redacted] weeks pregnant  Denies pain or discomfort. No fevers or chills. No other vaginal discharge. No fertility treatments.   Past Medical History  Diagnosis Date  . Asthma   . Rheumatoid factor positive     There are no active problems to display for this patient.   No past surgical history on file.  Current Outpatient Rx  Name  Route  Sig  Dispense  Refill  . acetaminophen (TYLENOL) 500 MG tablet   Oral   Take 500 mg by mouth every 8 (eight) hours as needed for mild pain.         Marland Kitchen. albuterol (PROVENTIL HFA;VENTOLIN HFA) 108 (90 BASE) MCG/ACT inhaler   Inhalation   Inhale 2 puffs into the lungs every 6 (six) hours as needed for wheezing or shortness of breath.   1 Inhaler   0   . promethazine (PHENERGAN) 25 MG tablet   Oral   Take 1 tablet (25 mg total) by mouth every 8 (eight) hours as needed for nausea or vomiting.   30 tablet   0   . ranitidine  (ZANTAC) 300 MG tablet   Oral   Take 300 mg by mouth every morning.           Allergies Review of patient's allergies indicates no known allergies.  No family history on file.  Social History Social History  Substance Use Topics  . Smoking status: Light Tobacco Smoker    Types: Cigarettes  . Smokeless tobacco: None  . Alcohol Use: No    Review of Systems Constitutional: No fever/chills Eyes: No visual changes. ENT: No sore throat. Cardiovascular: Denies chest pain. Respiratory: Denies shortness of breath. Gastrointestinal: No abdominal pain.  No nausea, no vomiting.  No diarrhea.  No constipation. Genitourinary: Negative for dysuria. Musculoskeletal: Negative for back pain. Skin: Negative for rash. Neurological: Negative for headaches, focal weakness or numbness.  10-point ROS otherwise negative.  ____________________________________________   PHYSICAL EXAM:  VITAL SIGNS: ED Triage Vitals  Enc Vitals Group     BP 10/26/15 1714 122/66 mmHg     Pulse Rate 10/26/15 1714 93     Resp 10/26/15 1714 16     Temp 10/26/15 1714 97.9 F (36.6 C)     Temp src --      SpO2 10/26/15 1800 98 %     Weight 10/26/15 1714 240 lb (108.863 kg)     Height 10/26/15 1714 5\' 2"  (1.575 m)  Head Cir --      Peak Flow --      Pain Score 10/26/15 1716 0     Pain Loc --      Pain Edu? --      Excl. in GC? --    Constitutional: Alert and oriented. Well appearing and in no acute distress. Eyes: Conjunctivae are normal. PERRL. EOMI. Head: Atraumatic. Nose: No congestion/rhinnorhea. Mouth/Throat: Mucous membranes are moist.  Oropharynx non-erythematous. Neck: No stridor.   Cardiovascular: Normal rate, regular rhythm. Grossly normal heart sounds.  Good peripheral circulation. Respiratory: Normal respiratory effort.  No retractions. Lungs CTAB. Gastrointestinal: Soft and nontender. No distention. No abdominal bruits. No CVA tenderness. No obvious evidence of gravid  uterus. Musculoskeletal: No lower extremity tenderness nor edema.  No joint effusions. Neurologic:  Normal speech and language. No gross focal neurologic deficits are appreciated. No gait instability. Skin:  Skin is warm, dry and intact. No rash noted. Psychiatric: Mood and affect are normal. Speech and behavior are normal.  ____________________________________________   LABS (all labs ordered are listed, but only abnormal results are displayed)  Labs Reviewed  HCG, QUANTITATIVE, PREGNANCY - Abnormal; Notable for the following:    hCG, Beta Francene Finders [id number] (*)    All other components within normal limits  CBC WITH DIFFERENTIAL/PLATELET - Abnormal; Notable for the following:    WBC 12.7 (*)    Neutro Abs 8.6 (*)    All other components within normal limits  BASIC METABOLIC PANEL - Abnormal; Notable for the following:    Calcium 8.8 (*)    All other components within normal limits  URINALYSIS COMPLETEWITH MICROSCOPIC ([facility] ONLY) - Abnormal; Notable for the following:    Color, Urine YELLOW (*)    APPearance CLOUDY (*)    Hgb urine dipstick 3+ (*)    Bacteria, UA RARE (*)    Squamous Epithelial / LPF 0-5 (*)    All other components within normal limits  PROTIME-INR  APTT  ABO/RH   ____________________________________________  EKG   ____________________________________________  RADIOLOGY  CLINICAL DATA: Vaginal bleeding for 1 day  EXAM: OBSTETRIC <14 WK [place] AND TRANSVAGINAL OB US  TECHNIQUE: Both transabdominal and transvaginal ultrasound examinations were performed for complete evaluation of the gestation as well as the maternal uterus, adnexal regions, and pelvic cul-de-sac. Transvaginal technique was performed to assess early pregnancy.  COMPARISON: October 12, 2015  FINDINGS: Intrauterine gestational sac: Visualized/normal in shape.  Yolk sac: Visualized  Embryo: Visualized  Cardiac Activity: Visualized  Heart Rate: 160  bpm  CRL: 16 mm 8 w 0 d [place] EDC: June 06, 2016  Maternal uterus/adnexae: There is no demonstrable subchorionic hemorrhage. Cervical os is closed. There is a corpus luteum arising from the left ovary measuring 1.9 x 1.5 cm. No free pelvic fluid.  IMPRESSION: Single live intrauterine gestation with estimated gestational age of [redacted] weeks. No demonstrable subchorionic hemorrhage. Cervical os closed.   Electronically Signed  By: Bretta Bang III M.D.  On: 10/26/2015 21:10 ____________________________________________   PROCEDURES  Procedure(s) performed: None  Critical Care performed: No  ____________________________________________   INITIAL IMPRESSION / ASSESSMENT AND PLAN / ED COURSE  Pertinent labs & imaging results that were available during my care of the patient were reviewed by me and considered in my medical decision making (see chart for details).  The patient presents for painless vaginal spotting after intercourse. About [redacted] weeks pregnant in no distress. She has a reassuring exam. HCG is notably rising from previous. She is  Rh- and I discussed with Dr. Evelene Croon (OB) advises repeat rhogam not advised as received 300 mcg (affirmed with blood bank) less than 1 month ago for non-major hemorrhage.  At this point, with painless spotting after intercourse and the patient demonstrating stable vital signs with no abdominal discomfort exam we will obtain a transvaginal ultrasound. No signs or symptoms of infection.  Patient does report she has had frequent morning nausea and "morning sickness". She was like a prescription for when she has nausea, I discussed risks and benefits and complications though unlikely and after discussion we will give the patient Phenergan. She is agreeable with this. ____________________________________________   FINAL CLINICAL IMPRESSION(S) / ED DIAGNOSES  Final diagnoses:  Vagina bleeding  First trimester  bleeding      Sharyn Creamer, MD 10/26/15 2351

## 2015-11-07 ENCOUNTER — Encounter: Payer: Self-pay | Admitting: Obstetrics and Gynecology

## 2017-03-16 ENCOUNTER — Ambulatory Visit
Admission: EM | Admit: 2017-03-16 | Discharge: 2017-03-16 | Disposition: A | Discharge status: Home / Self Care | Payer: Medicaid Other | Attending: Family Medicine | Admitting: Family Medicine

## 2017-03-16 DIAGNOSIS — J209 Acute bronchitis, unspecified: Secondary | ICD-10-CM

## 2017-03-16 MED ORDER — BENZONATATE 200 MG PO CAPS: 200.0000 mg | ORAL_CAPSULE | Freq: Three times a day (TID) | ORAL | 0 refills | Status: DC | PRN

## 2017-03-16 MED ORDER — AZITHROMYCIN 250 MG PO TABS: ORAL_TABLET | ORAL | 0 refills | Status: DC

## 2017-03-16 MED ORDER — PREDNISONE 10 MG (21) PO TBPK: ORAL_TABLET | ORAL | 0 refills | Status: DC

## 2017-03-16 MED ORDER — ALBUTEROL SULFATE HFA 108 (90 BASE) MCG/ACT IN AERS: 2.0000 | INHALATION_SPRAY | RESPIRATORY_TRACT | 0 refills | Status: DC | PRN

## 2017-03-16 NOTE — ED Triage Notes (Signed)
Patient complains of cough, wheezing, non-productive, hx of asthma, no fever, no appetite. Patient states that her symptoms started 3 days ago. Patient has been using neb treatments and inhalers without relief.

## 2017-03-16 NOTE — ED Provider Notes (Signed)
[facility]    CSN: [id number] Arrival date & time: 03/16/17  1248     History   Chief Complaint Chief Complaint  Patient presents with  . Cough    HPI Brandy Davies is a 31 y.o. female.   Patient is a 31 year old white female is here because of wheezing shortness of breath. She has a history of bronchospasm and bronchitis before in the past and asthma. She does smoke. No known drug allergies. She is also has history of rheumatoid factor positive status. She states she is has now been on inhaler was almost out. Her son has been sick and she states that she's not sure that she got from him but she started to wheeze and cough on Wednesday. She is coughing up yellowish-green material and is getting worse. Known drug allergies. No pertinent family medical history otherwise relevant to today's visit she states that none of the over-the-counter cough suppressants have helped her.   The history is provided by the patient. No language interpreter was used.  Cough  Cough characteristics:  Productive Sputum characteristics:  Green and yellow Onset quality:  Sudden Timing:  Constant Progression:  Worsening Chronicity:  New Smoker: yes   Context: sick contacts and upper respiratory infection   Relieved by:  Nothing Worsened by:  Nothing Ineffective treatments:  Cough suppressants Associated symptoms: shortness of breath, sinus congestion and wheezing   Risk factors: recent infection     Past Medical History:  Diagnosis Date  . Asthma   . Rheumatoid factor positive     There are no active problems to display for this patient.   History reviewed. No pertinent surgical history.  OB History    No data available       Home Medications    Prior to Admission medications   Medication Sig Start Date End Date Taking? Authorizing Provider  albuterol (ACCUNEB) 0.63 MG/3ML nebulizer solution Take 1 ampule by nebulization every 6 (six) hours as needed for wheezing.    Yes Historical Provider, MD  ranitidine (ZANTAC) 300 MG tablet Take 300 mg by mouth every morning.   Yes Historical Provider, MD  acetaminophen (TYLENOL) 500 MG tablet Take 500 mg by mouth every 8 (eight) hours as needed for mild pain.    Historical Provider, MD  albuterol (PROVENTIL HFA;VENTOLIN HFA) 108 (90 BASE) MCG/ACT inhaler Inhale 2 puffs into the lungs every 6 (six) hours as needed for wheezing or shortness of breath. 06/23/15   Phineas Semen, MD  albuterol (PROVENTIL HFA;VENTOLIN HFA) 108 (90 Base) MCG/ACT inhaler Inhale 2 puffs into the lungs every 4 (four) hours as needed. 03/16/17   Hassan Rowan, MD  azithromycin (ZITHROMAX Z-PAK) 250 MG tablet Take 2 tablets first day and then 1 po a day for 4 days 03/16/17   Hassan Rowan, MD  benzonatate (TESSALON) 200 MG capsule Take 1 capsule (200 mg total) by mouth 3 (three) times daily as needed. 03/16/17   Hassan Rowan, MD  predniSONE (STERAPRED UNI-PAK 21 TAB) 10 MG (21) TBPK tablet Sig 6 tablet day 1, 5 tablets day 2, 4 tablets day 3,,3tablets day 4, 2 tablets day 5, 1 tablet day 6 take all tablets orally 03/16/17   Hassan Rowan, MD  promethazine (PHENERGAN) 25 MG tablet Take 1 tablet (25 mg total) by mouth every 8 (eight) hours as needed for nausea or vomiting. 10/26/15   Sharyn Creamer, MD    Family History History reviewed. No pertinent family history.  Social History Social History  Substance  Use Topics  . Smoking status: Light Tobacco Smoker    Types: Cigarettes  . Smokeless tobacco: Never Used  . Alcohol use No     Allergies   Patient has no known allergies.   Review of Systems Review of Systems  Respiratory: Positive for cough, shortness of breath and wheezing.   All other systems reviewed and are negative.    Physical Exam Triage Vital Signs ED Triage Vitals  Enc Vitals Group     BP 03/16/17 1322 134/89     Pulse Rate 03/16/17 1322 97     Resp 03/16/17 1322 16     Temp 03/16/17 1322 98.1 F (36.7 C)     Temp Source  03/16/17 1322 Oral     SpO2 03/16/17 1322 97 %     Weight 03/16/17 1321 260 lb (117.9 kg)     Height 03/16/17 1321  (1.575 m)     Head Circumference --      Peak Flow --      Pain Score 03/16/17 1321 3     Pain Loc --      Pain Edu? --      Excl. in GC? --    No data found.   Updated Vital Signs BP 134/89 (BP Location: Left Arm)   Pulse 97   Temp 98.1 F (36.7 C) (Oral)   Resp 16   Ht  (1.575 m)   Wt 260 lb (117.9 kg)   LMP 02/08/2017   SpO2 97%   BMI 47.55 kg/m   Visual Acuity Right Eye Distance:   Left Eye Distance:   Bilateral Distance:    Right Eye Near:   Left Eye Near:    Bilateral Near:     Physical Exam  Constitutional: She is oriented to person, place, and time. Vital signs are normal. She appears well-developed and well-nourished.  Obese white female  HENT:  Head: Normocephalic and atraumatic.  Right Ear: Hearing, tympanic membrane, external ear and ear canal normal.  Left Ear: Hearing, tympanic membrane and ear canal normal.  Nose: Mucosal edema present.  Mouth/Throat: Uvula is midline, oropharynx is clear and moist and mucous membranes are normal.  Eyes: EOM are normal. Pupils are equal, round, and reactive to light.  Neck: Normal range of motion. Neck supple.  Cardiovascular: Normal rate and regular rhythm.   Pulmonary/Chest: She has wheezes.  Abdominal: Soft.  Musculoskeletal: Normal range of motion. She exhibits no edema.  Neurological: She is alert and oriented to person, place, and time.  Skin: Skin is warm.  Psychiatric: She has a normal mood and affect.  Vitals reviewed.    UC Treatments / Results  Labs (all labs ordered are listed, but only abnormal results are displayed) Labs Reviewed - No data to display  EKG  EKG Interpretation None       Radiology No results found.  Procedures Procedures (including critical care time)  Medications Ordered in UC Medications - No data to display   Initial Impression /  Assessment and Plan / UC Course  I have reviewed the triage vital signs and the nursing notes.  Pertinent labs & imaging results that were available during my care of the patient were reviewed by me and considered in my medical decision making (see chart for details).     Patient warned that she stop smoking will place her on Tessalon Perles for cough. Z-Pak. Proventil inhaler and 60 course of prednisone for. Recommend follow-up PCP in about a week  for proof of cure Final Clinical Impressions(s) / UC Diagnoses   Final diagnoses:  Acute bronchitis with bronchospasm    New Prescriptions Discharge Medication List as of 03/16/2017  2:10 PM    START taking these medications   Details  !! albuterol (PROVENTIL HFA;VENTOLIN HFA) 108 (90 Base) MCG/ACT inhaler Inhale 2 puffs into the lungs every 4 (four) hours as needed., Starting Sun 03/16/2017, Normal    azithromycin (ZITHROMAX Z-PAK) 250 MG tablet Take 2 tablets first day and then 1 po a day for 4 days, Normal    benzonatate (TESSALON) 200 MG capsule Take 1 capsule (200 mg total) by mouth 3 (three) times daily as needed., Starting Sun 03/16/2017, Normal    predniSONE (STERAPRED UNI-PAK 21 TAB) 10 MG (21) TBPK tablet Sig 6 tablet day 1, 5 tablets day 2, 4 tablets day 3,,3tablets day 4, 2 tablets day 5, 1 tablet day 6 take all tablets orally, Normal     !! - Potential duplicate medications found. Please discuss with provider.      Note: This dictation was prepared with Dragon dictation along with smaller phrase technology. Any transcriptional errors that result from this process are unintentional.   Hassan Rowan, MD 03/16/17 1438

## 2017-06-21 IMAGING — US US OB COMP LESS 14 WK
1 series · 14 of 28 positions shown · non-contrast
Comparison: October 12, 2015

CLINICAL DATA: Vaginal bleeding for 1 day

EXAM:
OBSTETRIC <14 WK US AND TRANSVAGINAL OB US
TECHNIQUE: Both transabdominal and transvaginal ultrasound examinations were
performed for complete evaluation of the gestation as well as the
maternal uterus, adnexal regions, and pelvic cul-de-sac.
Transvaginal technique was performed to assess early pregnancy.

[Series 1: us ob comp less 14 wk · 0.18mm/px · 14 of 89 slices shown]
[im 4/89]
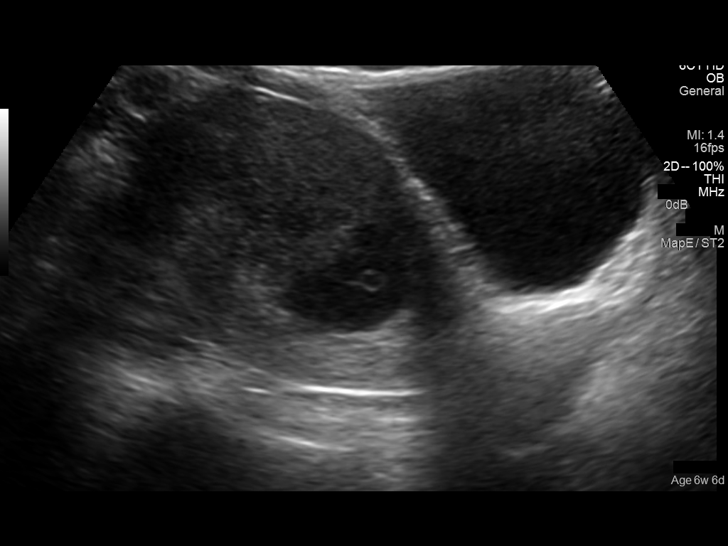
[im 10/89]
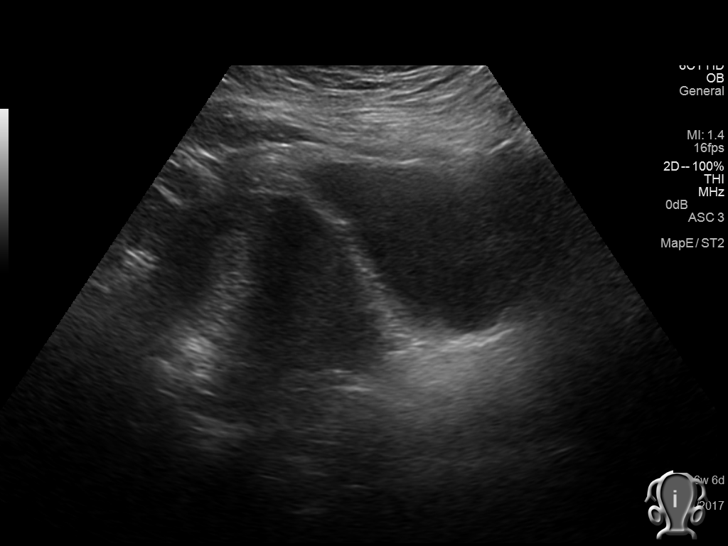
[im 17/89]
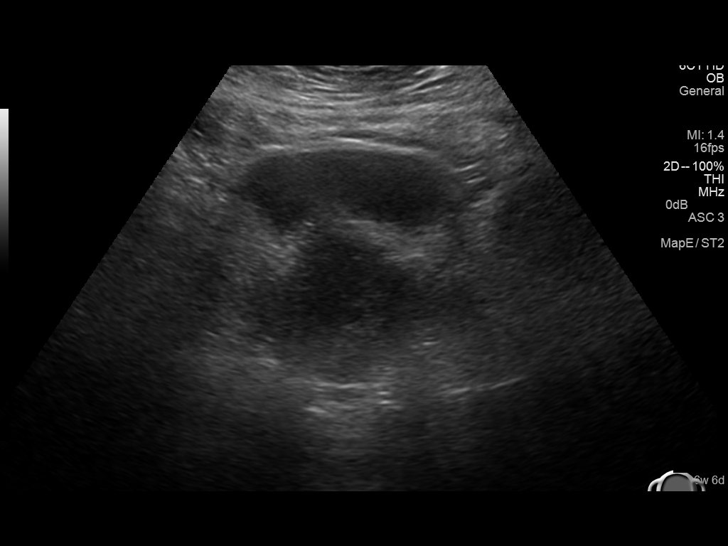
[im 23/89]
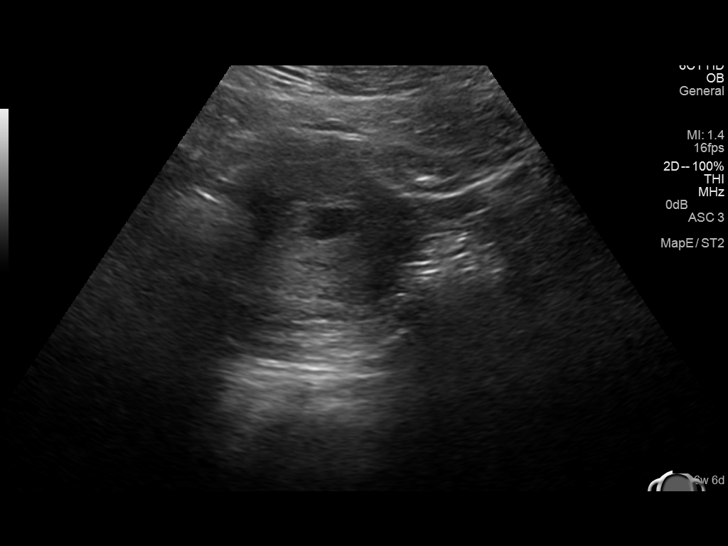
[im 30/89]
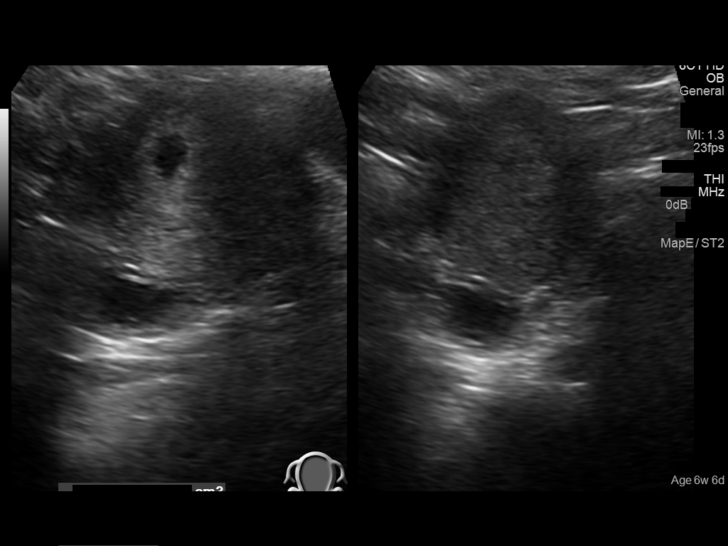
[im 36/89]
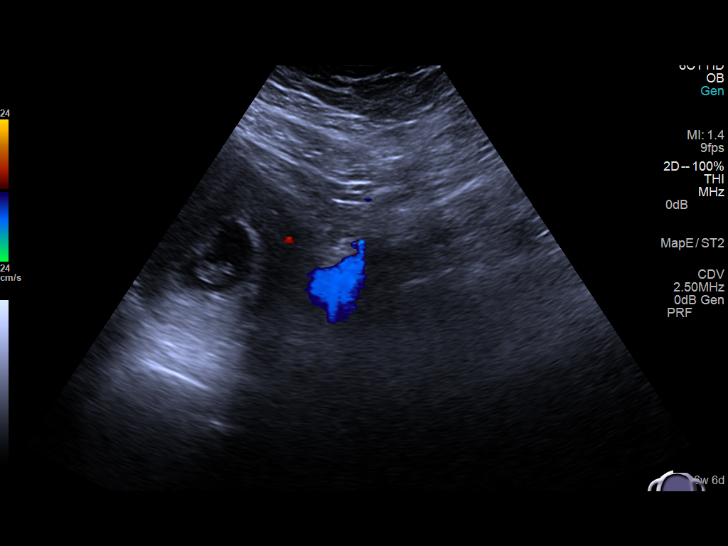
[im 43/89]
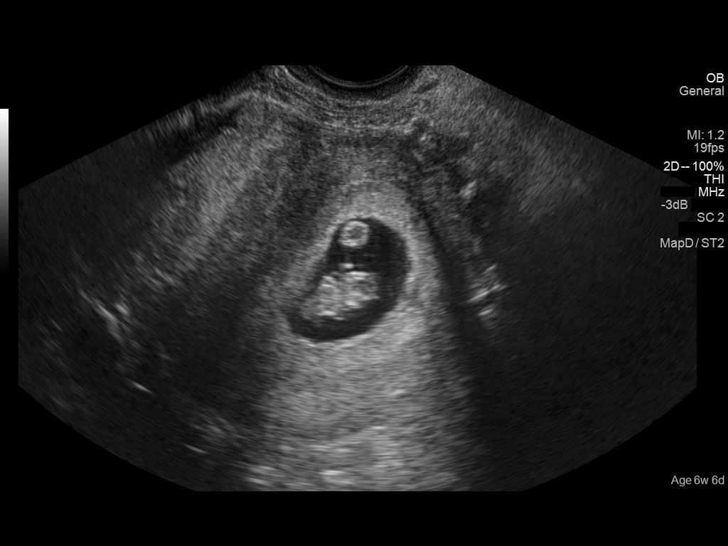
[im 49/89]
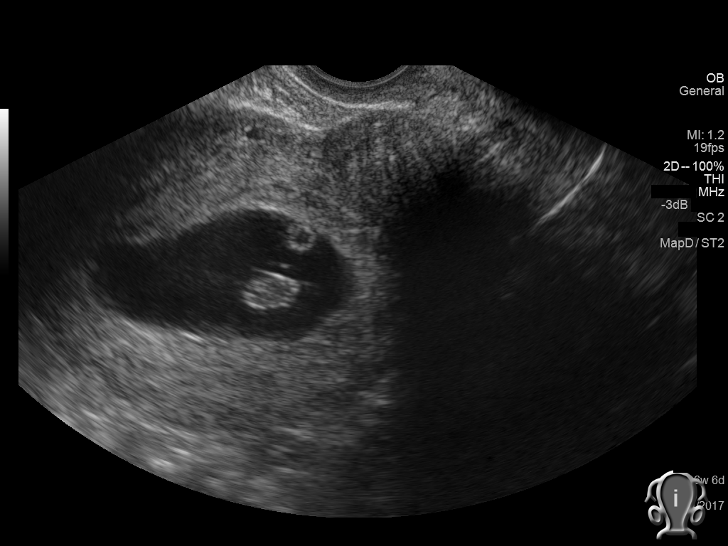
[im 56/89]
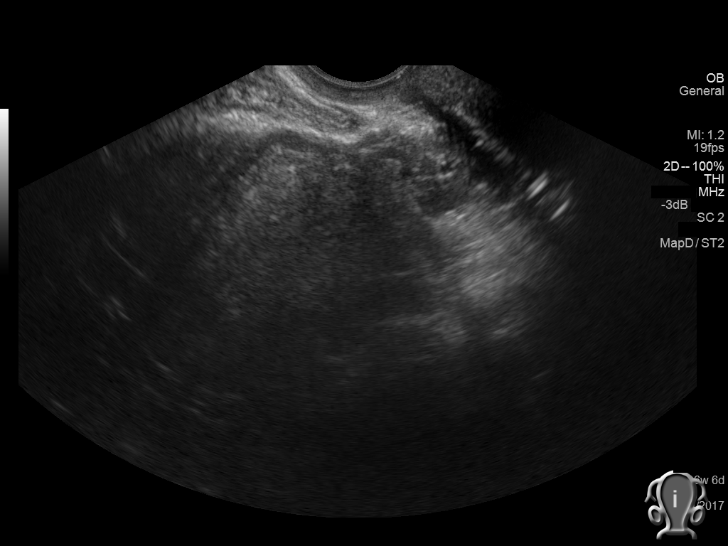
[im 62/89]
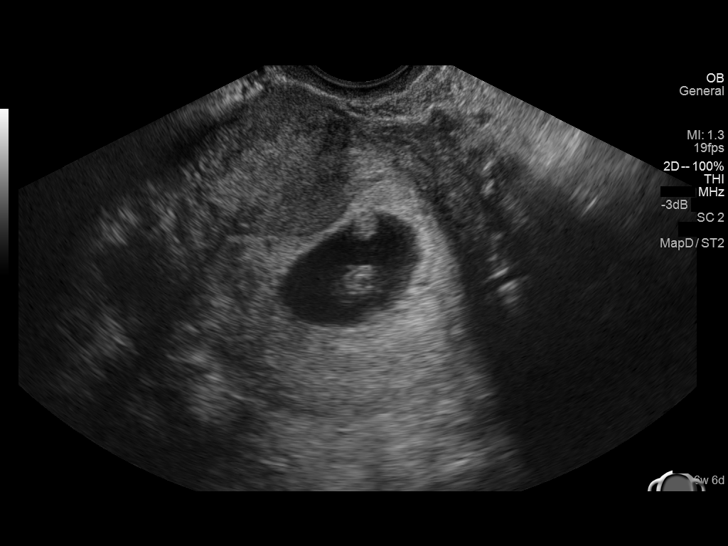
[im 69/89]
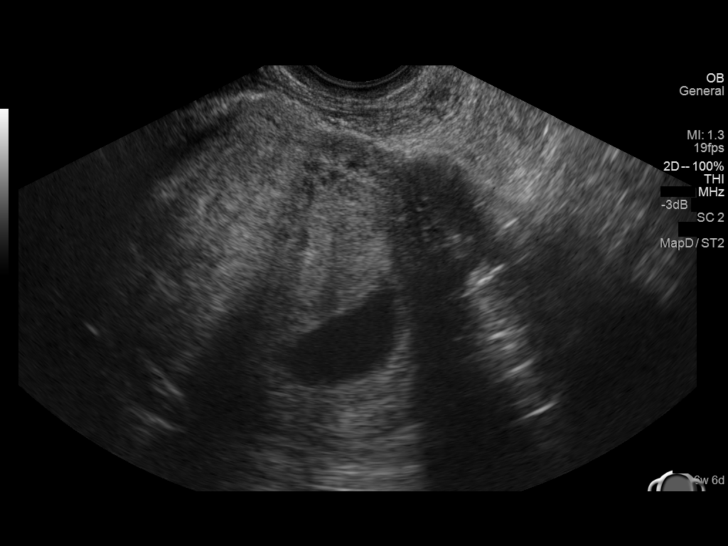
[im 75/89]
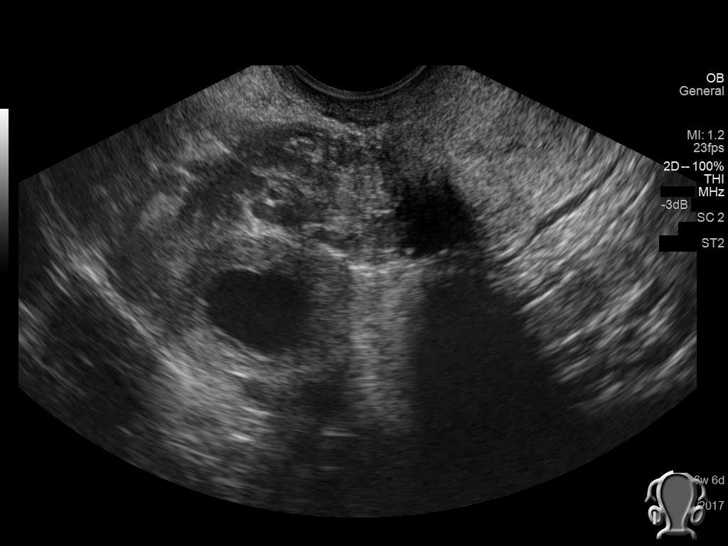
[im 82/89]
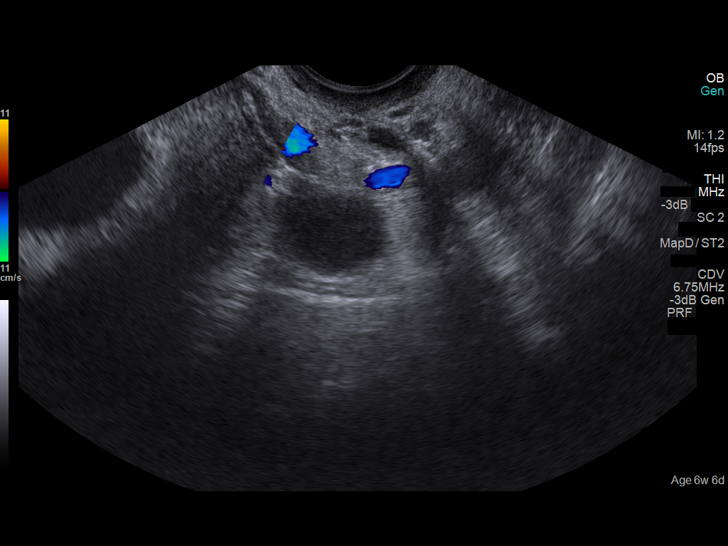
[im 89/89]
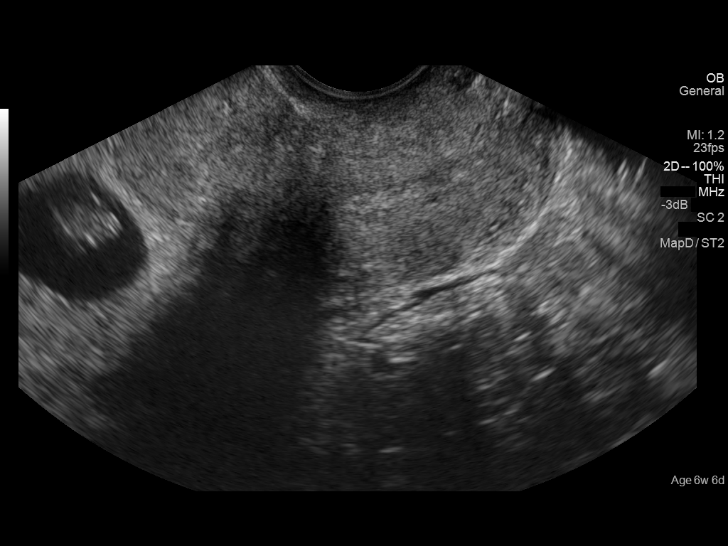

[14 of 28 positions shown; findings below may reference images not displayed]

FINDINGS: Intrauterine gestational sac: Visualized/normal in shape.

Yolk sac:  Visualized

Embryo:  Visualized

Cardiac Activity: Visualized

Heart Rate: 160  bpm

CRL:  16  mm   8 w   0 d                  US EDC: June 06, 2016

Maternal uterus/adnexae: There is no demonstrable subchorionic
hemorrhage. Cervical os is closed. There is a corpus luteum arising
from the left ovary measuring 1.9 x 1.5 cm. No free pelvic fluid.
IMPRESSION: Single live intrauterine gestation with estimated gestational age of
8 weeks. No demonstrable subchorionic hemorrhage. Cervical os
closed.

## 2021-11-29 ENCOUNTER — Ambulatory Visit (INDEPENDENT_AMBULATORY_CARE_PROVIDER_SITE_OTHER): Payer: Medicaid Other

## 2021-11-29 ENCOUNTER — Ambulatory Visit
Admission: EM | Admit: 2021-11-29 | Discharge: 2021-11-29 | Disposition: A | Discharge status: Home / Self Care | Payer: Medicaid Other | Attending: Emergency Medicine | Admitting: Emergency Medicine

## 2021-11-29 ENCOUNTER — Other Ambulatory Visit: Payer: Self-pay

## 2021-11-29 DIAGNOSIS — L03113 Cellulitis of right upper limb: Secondary | ICD-10-CM | POA: Diagnosis not present

## 2021-11-29 DIAGNOSIS — M79641 Pain in right hand: Secondary | ICD-10-CM

## 2021-11-29 DIAGNOSIS — R519 Headache, unspecified: Secondary | ICD-10-CM

## 2021-11-29 MED ORDER — DOXYCYCLINE HYCLATE 100 MG PO CAPS: 100.0000 mg | ORAL_CAPSULE | Freq: Two times a day (BID) | ORAL | 0 refills | Status: AC

## 2021-11-29 MED ORDER — BUTALBITAL-APAP-CAFFEINE 50-325-40 MG PO TABS: 1.0000 | ORAL_TABLET | Freq: Four times a day (QID) | ORAL | 0 refills | Status: AC | PRN

## 2021-11-29 NOTE — ED Triage Notes (Signed)
Pt states that she woke up on Friday and her right hand was swollen. Pt states that the swelling is moving up her arm and her hand is tender to touch. Pt states that she has swelling along her right cheek and has a headache and is dizzy.

## 2021-11-29 NOTE — ED Provider Notes (Signed)
[facility]    CSN: [id number] Arrival date & time: 11/29/21  1701      History   Chief Complaint Chief Complaint  Patient presents with   Headache   Arm Problem    HPI Brandy Davies is a 35 y.o. female.   Istory of migraines  Patient presents with right hand swelling that began 6 days ago.  Symptoms began abruptly upon awakening.  Endorses that 1 day ago swelling began to move into right forearm.  Endorses tightness of the extremity, warmth to the skin and tenderness to the touch.  Has attempted use of ice for treatment which has not been effective.  Concerned for possible blood clot.  No cardiac history, familial history of CVA and hypertension.  Endorses current migraine with associated dizziness.  Denies blurred vision, photosensitivity, phonophobia, nausea.  Has attempted use of over-the-counter medications and increase hydration with no relief.  Past Medical History:  Diagnosis Date   Asthma    Rheumatoid factor positive     There are no problems to display for this patient.   History reviewed. No pertinent surgical history.  OB History   No obstetric history on file.      Home Medications    Prior to Admission medications   Medication Sig Start Date End Date Taking? Authorizing Provider  acetaminophen (TYLENOL) 500 MG tablet Take 500 mg by mouth every 8 (eight) hours as needed for mild pain.   Yes [provider]  albuterol (PROVENTIL HFA;VENTOLIN HFA) 108 (90 BASE) MCG/ACT inhaler Inhale 2 puffs into the lungs every 6 (six) hours as needed for wheezing or shortness of breath. 06/23/15  Yes Phineas Semen, MD  albuterol (ACCUNEB) 0.63 MG/3ML nebulizer solution Take 1 ampule by nebulization every 6 (six) hours as needed for wheezing.    [provider]  albuterol (PROVENTIL HFA;VENTOLIN HFA) 108 (90 Base) MCG/ACT inhaler Inhale 2 puffs into the lungs every 4 (four) hours as needed. 03/16/17   Hassan Rowan, MD  azithromycin  (ZITHROMAX Z-PAK) 250 MG tablet Take 2 tablets first day and then 1 po a day for 4 days 03/16/17   Hassan Rowan, MD  benzonatate (TESSALON) 200 MG capsule Take 1 capsule (200 mg total) by mouth 3 (three) times daily as needed. 03/16/17   Hassan Rowan, MD  predniSONE (STERAPRED UNI-PAK 21 TAB) 10 MG (21) TBPK tablet Sig 6 tablet day 1, 5 tablets day 2, 4 tablets day 3,,3tablets day 4, 2 tablets day 5, 1 tablet day 6 take all tablets orally 03/16/17   Hassan Rowan, MD  promethazine (PHENERGAN) 25 MG tablet Take 1 tablet (25 mg total) by mouth every 8 (eight) hours as needed for nausea or vomiting. 10/26/15   Sharyn Creamer, MD  ranitidine (ZANTAC) 300 MG tablet Take 300 mg by mouth every morning.    [provider]    Family History History reviewed. No pertinent family history.  Social History Social History   Tobacco Use   Smoking status: Light Smoker    Types: Cigarettes   Smokeless tobacco: Never  Vaping Use   Vaping Use: Some days  Substance Use Topics   Alcohol use: Yes   Drug use: No     Allergies   Patient has no known allergies.   Review of Systems Review of Systems  Constitutional: Negative.   Respiratory: Negative.    Cardiovascular: Negative.   Gastrointestinal: Negative.   Neurological:  Positive for headaches. Negative for dizziness, tremors, seizures, syncope, facial asymmetry, speech  difficulty, weakness, light-headedness and numbness.    Physical Exam Triage Vital Signs ED Triage Vitals  Enc Vitals Group     BP 11/29/21 1717 (!) 126/96     Pulse Rate 11/29/21 1717 81     Resp 11/29/21 1717 18     Temp 11/29/21 1717 98.4 F (36.9 C)     Temp Source 11/29/21 1717 Oral     SpO2 11/29/21 1717 98 %     Weight --      Height 11/29/21 1714 5\' 2"  (1.575 m)     Head Circumference --      Peak Flow --      Pain Score 11/29/21 1713 2     Pain Loc --      Pain Edu? --      Excl. in GC? --    No data found.  Updated Vital Signs BP (!) 126/96 (BP  Location: Left Arm)    Pulse 81    Temp 98.4 F (36.9 C) (Oral)    Resp 18    Ht 5\' 2"  (1.575 m)    LMP 11/29/2021 Comment: On and off between spotting and heavy flow for the month of December 2022.   SpO2 98%    BMI 47.55 kg/m   Visual Acuity Right Eye Distance:   Left Eye Distance:   Bilateral Distance:    Right Eye Near:   Left Eye Near:    Bilateral Near:     Physical Exam Pulmonary:     Effort: Pulmonary effort is normal.  Skin:    Comments: 1+ pitting edema to dorsal aspect of right hand, mild swelling to right forearm, ROM intact, sensation intact, 2+ radial pulse   Neurological:     General: No focal deficit present.     Mental Status: She is oriented to person, place, and time. Mental status is at baseline.  Psychiatric:        Mood and Affect: Mood normal.        Behavior: Behavior normal.     UC Treatments / Results  Labs (all labs ordered are listed, but only abnormal results are displayed) Labs Reviewed - No data to display  EKG   Radiology No results found.  Procedures Procedures (including critical care time)  Medications Ordered in UC Medications - No data to display  Initial Impression / Assessment and Plan / UC Course  I have reviewed the triage vital signs and the nursing notes.  Pertinent labs & imaging results that were available during my care of the patient were reviewed by me and considered in my medical decision making (see chart for details).  Cellulitis of right hand Bad headache  X-ray of right hand negative for injury, soft tissue swelling consistent with cellulitis, discussed findings with patient, prescribed doxycycline 100 mg twice daily for 7 days, recommended follow-up with PCP in 1 to 2 weeks for reevaluation, can use over-the-counter medications for discomfort for worsening symptoms patient to go to the nearest emergency department for evaluation of intravenous antibiotics  No neurological deficits on exam, history of  migraines, has used Fioricet in the past with success, prescribed to be used every 6 hours as needed with follow-up with primary care doctor for the management Final Clinical Impressions(s) / UC Diagnoses   Final diagnoses:  None   Discharge Instructions   None    ED Prescriptions   None    PDMP not reviewed this encounter.   12/01/2021, NP 11/29/21 1827

## 2021-11-29 NOTE — Discharge Instructions (Signed)
Your x-ray was negative for injury but the swelling noted was consistent with cellulitis which is a infection of the skin and soft tissues  Take doxycycline twice a day for the next 7 days  Please follow-up with primary care doctor in the next 1 to 2 weeks after completion of antibiotics for reevaluation of arm  At any point if symptoms worsen please go to nearest emergency department for evaluation for intravenous antibiotics  Your Fioricet has been refilled,  please follow-up with primary doctor for further refills

## 2022-07-20 IMAGING — CR DG HAND COMPLETE 3+V*R*
3 series · 3 of 3 positions shown · non-contrast
Comparison: None.

CLINICAL DATA: Right hand swelling extending in the arm.

EXAM:
RIGHT HAND - COMPLETE 3+ VIEW

[hand ap]
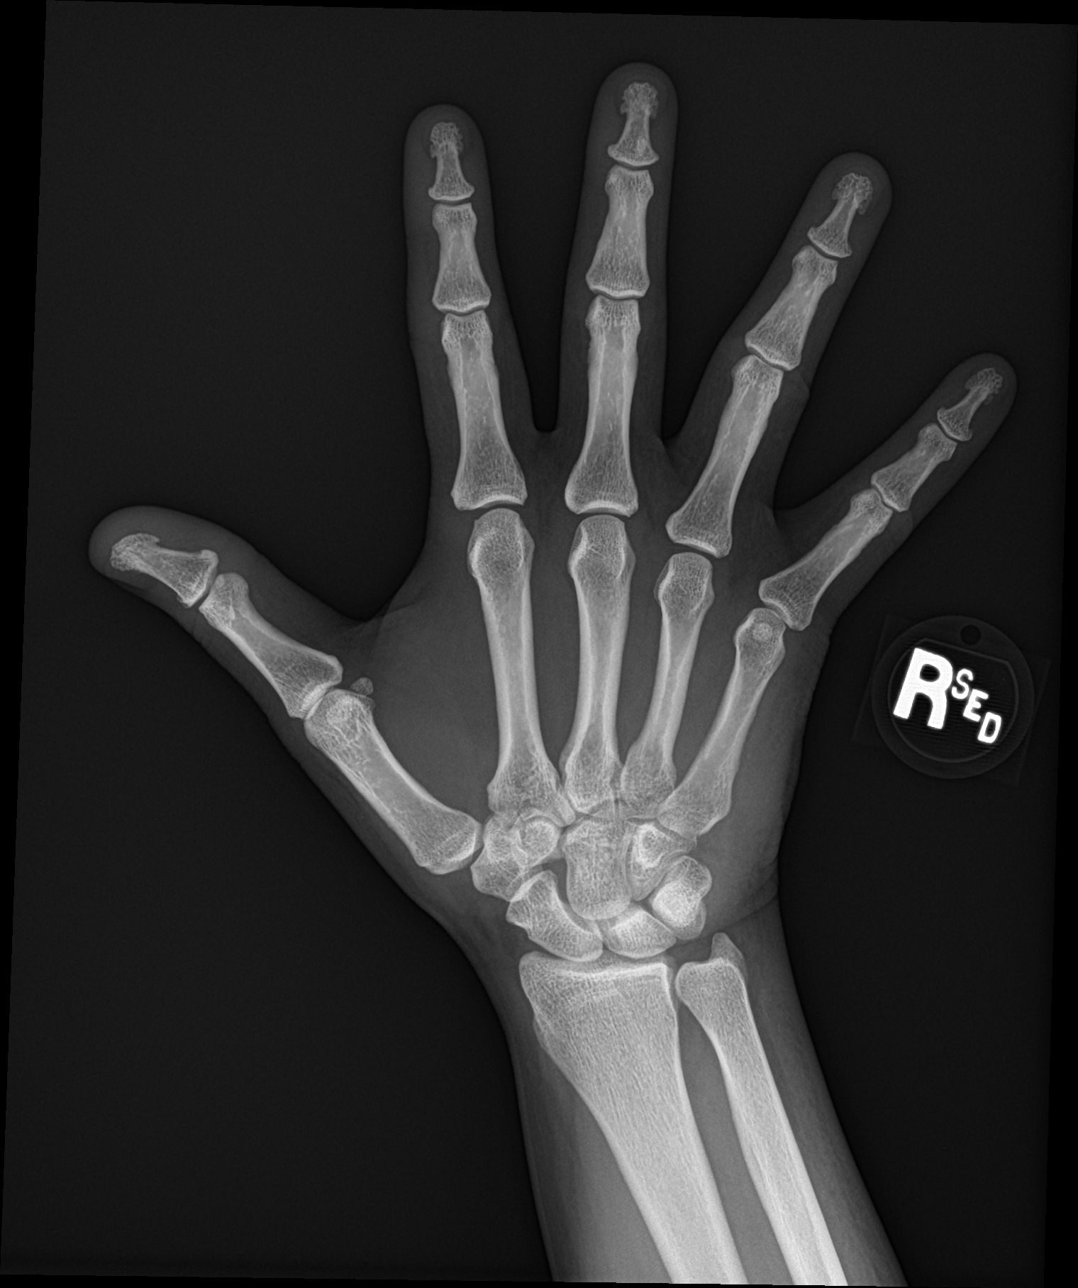

[hand obl]
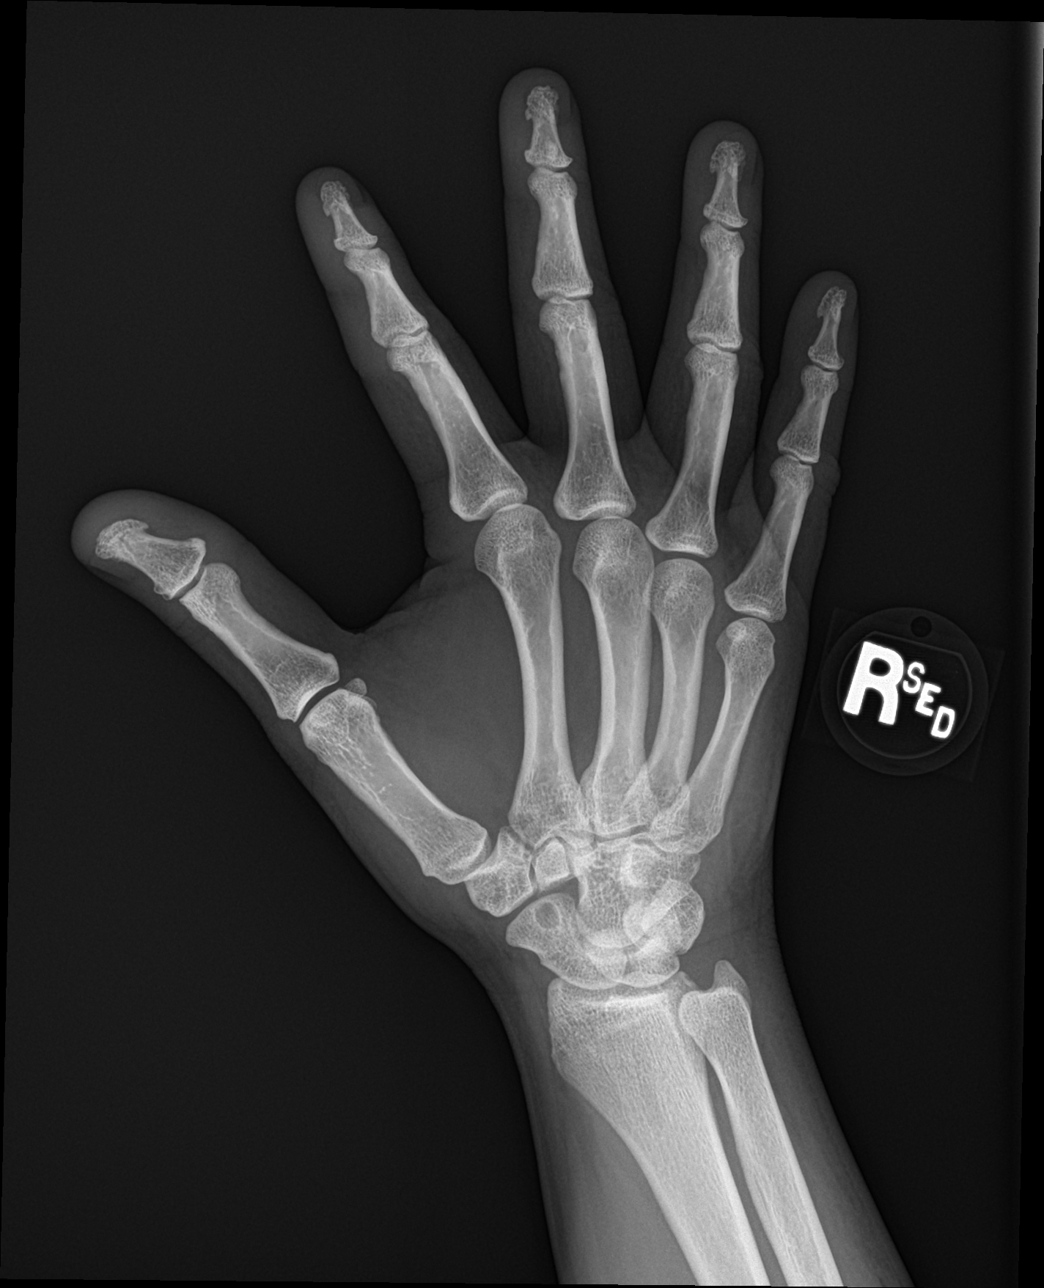

[hand lat]
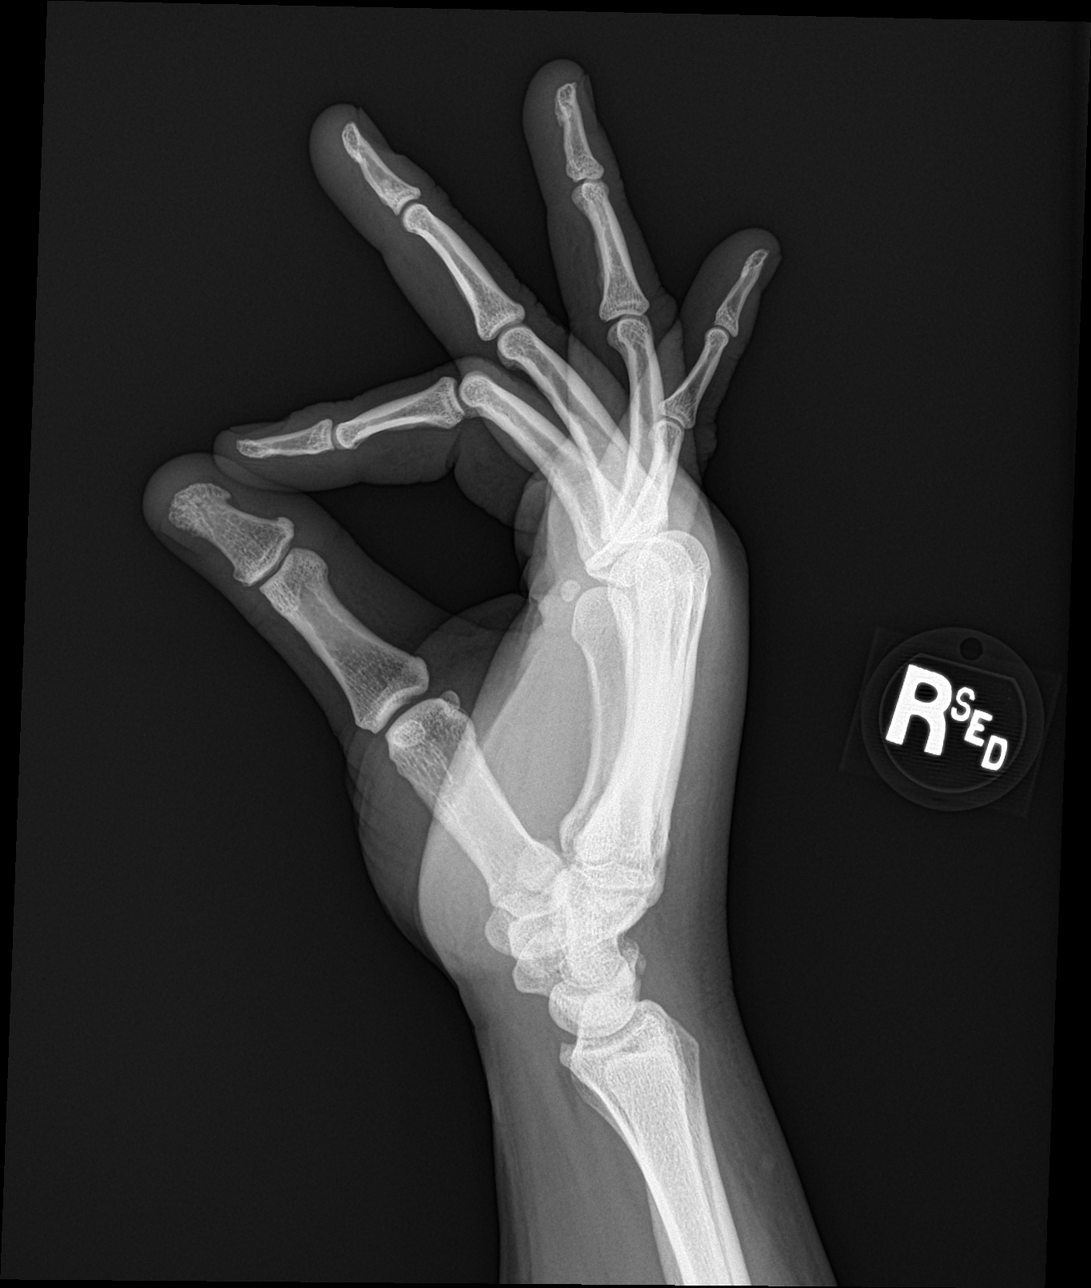

[3 of 3 positions shown; findings below may reference images not displayed]

FINDINGS: Soft tissue swelling is present over the dorsum of the hand. No
underlying acute osseous abnormalities are present. No radiopaque
foreign body is present.
IMPRESSION: Soft tissue swelling over the dorsum of the hand without underlying
fracture or radiopaque foreign body. Findings are concerning for
cellulitis.
